# Patient Record
Sex: Male | Born: 1995 | Hispanic: No | Marital: Single | Smoking: Current every day smoker
Health system: Southern US, Community
[De-identification: ages and names within clinical notes are randomized; demographics above are authoritative.]

---
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
## 2018-01-17 ENCOUNTER — Emergency Department (HOSPITAL_COMMUNITY): Payer: Self-pay

## 2018-01-17 ENCOUNTER — Other Ambulatory Visit: Payer: Self-pay

## 2018-01-17 ENCOUNTER — Emergency Department (HOSPITAL_COMMUNITY)
Admission: EM | Admit: 2018-01-17 | Discharge: 2018-01-17 | Disposition: A | Discharge status: Home / Self Care | Payer: Self-pay | Attending: Emergency Medicine | Admitting: Emergency Medicine

## 2018-01-17 ENCOUNTER — Encounter (HOSPITAL_COMMUNITY): Payer: Self-pay

## 2018-01-17 DIAGNOSIS — Y929 Unspecified place or not applicable: Secondary | ICD-10-CM | POA: Insufficient documentation

## 2018-01-17 DIAGNOSIS — M7918 Myalgia, other site: Secondary | ICD-10-CM

## 2018-01-17 DIAGNOSIS — S0990XA Unspecified injury of head, initial encounter: Secondary | ICD-10-CM | POA: Insufficient documentation

## 2018-01-17 DIAGNOSIS — Z041 Encounter for examination and observation following transport accident: Secondary | ICD-10-CM | POA: Insufficient documentation

## 2018-01-17 DIAGNOSIS — Y939 Activity, unspecified: Secondary | ICD-10-CM | POA: Insufficient documentation

## 2018-01-17 DIAGNOSIS — Y999 Unspecified external cause status: Secondary | ICD-10-CM | POA: Insufficient documentation

## 2018-01-17 DIAGNOSIS — M791 Myalgia, unspecified site: Secondary | ICD-10-CM | POA: Insufficient documentation

## 2018-01-17 MED ORDER — IBUPROFEN 800 MG PO TABS: 800.0000 mg | ORAL_TABLET | Freq: Three times a day (TID) | ORAL | 0 refills | Status: AC

## 2018-01-17 MED ORDER — CYCLOBENZAPRINE HCL 5 MG PO TABS: 5.0000 mg | ORAL_TABLET | Freq: Three times a day (TID) | ORAL | 0 refills | Status: AC | PRN

## 2018-01-17 MED ORDER — IBUPROFEN 800 MG PO TABS
800.0000 mg | ORAL_TABLET | Freq: Once | ORAL | Status: AC
  Administered 2018-01-17: 800 mg via ORAL
  Filled 2018-01-17: qty 1
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
## 2018-01-17 NOTE — ED Notes (Signed)
Patient transported to CT 
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
## 2020-08-05 ENCOUNTER — Other Ambulatory Visit: Payer: Medicaid Other
